# Patient Record
Sex: Male | Born: 1980 | Hispanic: No | Marital: Single | Smoking: Never smoker
Health system: Southern US, Community
[De-identification: ages and names within clinical notes are randomized; demographics above are authoritative.]

## PROBLEM LIST (undated history)

## (undated) DIAGNOSIS — I10 Essential (primary) hypertension: Secondary | ICD-10-CM

## (undated) DIAGNOSIS — E785 Hyperlipidemia, unspecified: Secondary | ICD-10-CM

---

## 1999-09-24 ENCOUNTER — Emergency Department (HOSPITAL_COMMUNITY): Admission: EM | Admit: 1999-09-24 | Discharge: 1999-09-24 | Payer: Self-pay | Admitting: Emergency Medicine

## 2016-09-20 ENCOUNTER — Encounter (HOSPITAL_COMMUNITY): Payer: Self-pay | Admitting: Emergency Medicine

## 2016-09-20 ENCOUNTER — Emergency Department (HOSPITAL_COMMUNITY): Payer: Self-pay

## 2016-09-20 ENCOUNTER — Emergency Department (HOSPITAL_COMMUNITY)
Admission: EM | Admit: 2016-09-20 | Discharge: 2016-09-20 | Disposition: A | Discharge status: Home / Self Care | Payer: Self-pay | Attending: Emergency Medicine | Admitting: Emergency Medicine

## 2016-09-20 DIAGNOSIS — S51812A Laceration without foreign body of left forearm, initial encounter: Secondary | ICD-10-CM | POA: Insufficient documentation

## 2016-09-20 DIAGNOSIS — Y929 Unspecified place or not applicable: Secondary | ICD-10-CM | POA: Insufficient documentation

## 2016-09-20 DIAGNOSIS — I10 Essential (primary) hypertension: Secondary | ICD-10-CM | POA: Insufficient documentation

## 2016-09-20 DIAGNOSIS — Z23 Encounter for immunization: Secondary | ICD-10-CM | POA: Insufficient documentation

## 2016-09-20 DIAGNOSIS — Y939 Activity, unspecified: Secondary | ICD-10-CM | POA: Insufficient documentation

## 2016-09-20 DIAGNOSIS — W25XXXA Contact with sharp glass, initial encounter: Secondary | ICD-10-CM | POA: Insufficient documentation

## 2016-09-20 DIAGNOSIS — Y990 Civilian activity done for income or pay: Secondary | ICD-10-CM | POA: Insufficient documentation

## 2016-09-20 DIAGNOSIS — S60221A Contusion of right hand, initial encounter: Secondary | ICD-10-CM | POA: Insufficient documentation

## 2016-09-20 HISTORY — DX: Essential (primary) hypertension: I10

## 2016-09-20 HISTORY — DX: Hyperlipidemia, unspecified: E78.5

## 2016-09-20 MED ORDER — IBUPROFEN 800 MG PO TABS: 800.0000 mg | ORAL_TABLET | Freq: Three times a day (TID) | ORAL | 0 refills | Status: AC | PRN

## 2016-09-20 MED ORDER — LIDOCAINE HCL 1 % IJ SOLN
INTRAMUSCULAR | Status: AC
  Filled 2016-09-20: qty 20

## 2016-09-20 MED ORDER — TETANUS-DIPHTH-ACELL PERTUSSIS 5-2.5-18.5 LF-MCG/0.5 IM SUSP
0.5000 mL | Freq: Once | INTRAMUSCULAR | Status: AC
  Administered 2016-09-20: 0.5 mL via INTRAMUSCULAR
  Filled 2016-09-20: qty 0.5

## 2016-09-20 MED ORDER — LIDOCAINE HCL (PF) 1 % IJ SOLN: 5.0000 mL | Freq: Once | INTRAMUSCULAR | Status: DC

## 2016-09-20 MED ORDER — IBUPROFEN 800 MG PO TABS
800.0000 mg | ORAL_TABLET | Freq: Once | ORAL | Status: AC
  Administered 2016-09-20: 800 mg via ORAL
  Filled 2016-09-20: qty 1

## 2016-09-20 NOTE — ED Triage Notes (Signed)
Pt declined xrays

## 2016-09-20 NOTE — ED Triage Notes (Signed)
Per EMS, patient from work, where he was replacing a window. Window fell and shattered on patient's head. Patient denies LOC and head pain. Patient does report injury to right hand and approx 1 cm laceration to left arm, bleeding controlled. Ambulatory with EMS.

## 2016-09-20 NOTE — ED Notes (Signed)
Bed: WTR6 Expected date:  Expected time:  Means of arrival:  Comments: 

## 2016-09-20 NOTE — Discharge Instructions (Signed)
Read the information below.  Use the prescribed medication as directed.  Please discuss all new medications with your pharmacist.  You may return to the Emergency Department at any time for worsening condition or any new symptoms that concern you.    If you develop redness, swelling, pus draining from the wound, or fevers greater than 100.4, return to the ER immediately for a recheck.    Lea la informacin a continuacin. Use la medicacin prescrita segn lo dirigido. Discuta todos los medicamentos nuevos con su farmacutico. Puede regresar al Departamento de Emergencia en cualquier momento por empeoramiento de la condicin o cualquier sntoma nuevo que le preocupe. Si desarrolla enrojecimiento, hinchazn, pus drenante de la herida o fiebres mayores de 100.4, regrese a la sala de urgencias inmediatamente para una nueva comprobacin.

## 2016-09-20 NOTE — ED Provider Notes (Signed)
[facility]-EMERGENCY DEPT Provider Note   CSN: [id number] Arrival date & time: 09/20/16  1144  By signing my name below, I, Placido Sou, attest that this documentation has been prepared under the direction and in the presence of [facility], PA-C. Electronically Signed: Placido Sou, ED Scribe. 09/20/16. 1:21 PM.   History   Chief Complaint Chief Complaint  Patient presents with  . Extremity Laceration    HPI HPI Comments: Steven DEMEO is a 35 y.o. male who is right hand dominant presents to the Emergency Department by ambulance complaining of multiple lacerations with controlled bleeding which occurred PTA. Pt states that he was replacing a large glass window which was dropped and fell striking his head before shattering. He reports an mild laceration to his left volar forearm as well as right dorsal hand. Pt has associated, mild, pain across the affected regions. He is not on anticoagulation. Pt takes regular medications for HTN and high cholesterol. He is unsure of the timing of his most recent tetanus vaccination. No pain or lacerations to head, foreign bodies in eyes, numbness, tingling, weakness or other associated symptoms at this time.   The history is provided by the patient. No language interpreter was used.   Past Medical History:  Diagnosis Date  . Hyperlipidemia   . Hypertension     There are no active problems to display for this patient.   History reviewed. No pertinent surgical history.   Home Medications    Prior to Admission medications   Medication Sig Start Date End Date Taking? Authorizing Provider  ibuprofen (ADVIL,MOTRIN) 800 MG tablet Take 1 tablet (800 mg total) by mouth every 8 (eight) hours as needed for mild pain or moderate pain. 09/20/16   Trixie Dredge, PA-C    Family History History reviewed. No pertinent family history.  Social History Social History  Substance Use Topics  . Smoking status: Never Smoker  . Smokeless tobacco: Never Used    . Alcohol use 1.8 oz/week    3 Shots of liquor per week   Allergies   Patient has no known allergies.  Review of Systems Review of Systems  Constitutional: Negative for fever.  Musculoskeletal: Positive for myalgias. Negative for arthralgias.  Skin: Positive for wound. Negative for color change and pallor.  Allergic/Immunologic: Negative for immunocompromised state.  Neurological: Negative for weakness and numbness.  Psychiatric/Behavioral: Negative for self-injury.   Physical Exam Updated Vital Signs BP 161/99 (BP Location: Left Arm)   Pulse 85   Temp 98.3 F (36.8 C) (Oral)   Resp 20   Ht 5' 6.53" (1.69 m)   Wt 90.7 kg   SpO2 97%   BMI 31.76 kg/m   Physical Exam  Constitutional: He appears well-developed and well-nourished. No distress.  HENT:  Head: Normocephalic and atraumatic.  Neck: Neck supple.  Cardiovascular: Intact distal pulses.   Pulmonary/Chest: Effort normal.  Musculoskeletal: He exhibits edema.  Neurological: He is alert. He has normal strength. No sensory deficit.  Skin: Ecchymosis and laceration noted. He is not diaphoretic.  Right dorsal hand with ecchymosis and edema as well as a small opening in the skin. Moves all fingers. Sensation intact. capillary refill <2 seconds. Grip strength 5/5. Left volar forearm with small laceration vs puncture wound. Hemostatic. FAROM of fingers. Sensation and pulses intact.   Nursing note and vitals reviewed.  ED Treatments / Results  Labs (all labs ordered are listed, but only abnormal results are displayed) Labs Reviewed - No data to display  EKG  EKG Interpretation None       Radiology Dg Forearm Left  Result Date: 09/20/2016 CLINICAL DATA:  Trauma from shattered window with laceration, initial encounter EXAM: LEFT FOREARM - 2 VIEW COMPARISON:  None. FINDINGS: There is no evidence of fracture or other focal bone lesions. Soft tissues are unremarkable. IMPRESSION: No acute abnormality noted.  Electronically Signed   By: Alcide CleverMark  Lukens M.D.   On: 09/20/2016 14:39   Dg Hand Complete Right  Result Date: 09/20/2016 CLINICAL DATA:  Contusion with broken glass EXAM: RIGHT HAND - COMPLETE 3+ VIEW COMPARISON:  None. FINDINGS: Four views of the right hand submitted. No acute fracture or subluxation. No definite radiopaque foreign body is identified. IMPRESSION: No acute fracture or subluxation. No radiopaque foreign body is identified. Electronically Signed   By: Natasha MeadLiviu  Pop M.D.   On: 09/20/2016 14:37    Procedures .Marland Kitchen.Laceration Repair Date/Time: 09/20/2016 3:00 PM Performed by: Trixie DredgeWEST, Karn Derk Authorized by: Trixie DredgeWEST, Chrisanna Mishra   Consent:    Consent obtained:  Verbal   Consent given by:  Patient   Risks discussed:  Pain   Alternatives discussed:  No treatment Anesthesia (see MAR for exact dosages):    Anesthesia method:  Local infiltration   Local anesthetic:  Lidocaine 1% w/o epi Laceration details:    Location:  Shoulder/arm   Shoulder/arm location:  L lower arm Repair type:    Repair type:  Simple Pre-procedure details:    Preparation:  Patient was prepped and draped in usual sterile fashion and imaging obtained to evaluate for foreign bodies Exploration:    Hemostasis achieved with:  Direct pressure   Wound exploration: wound explored through full range of motion and entire depth of wound probed and visualized     Contaminated: no   Treatment:    Amount of cleaning:  Standard   Visualized foreign bodies/material removed: no   Skin repair:    Repair method:  Sutures   Suture size:  5-0   Wound skin closure material used: Vicryl.   Suture technique:  Simple interrupted   Number of sutures:  2 Approximation:    Approximation:  Close   Vermilion border: well-aligned   Post-procedure details:    Patient tolerance of procedure:  Tolerated well, no immediate complications     DIAGNOSTIC STUDIES: Oxygen Saturation is 97% on RA, normal by my interpretation.    COORDINATION OF  CARE: 1:14 PM Discussed next steps with pt. Pt verbalized understanding and is agreeable with the plan.    Medications Ordered in ED Medications  Tdap (BOOSTRIX) injection 0.5 mL (0.5 mLs Intramuscular Given 09/20/16 1345)  ibuprofen (ADVIL,MOTRIN) tablet 800 mg (800 mg Oral Given 09/20/16 1323)    Initial Impression / Assessment and Plan / ED Course  I have reviewed the triage vital signs and the nursing notes.  Pertinent labs & imaging results that were available during my care of the patient were reviewed by me and considered in my medical decision making (see chart for details).  Clinical Course     Contusion to right hand and small laceration to left forearm after plate glass fell on him and broke.  Tetanus updated in the ED. Laceration occurred < 12 hours prior to repair. Discussed laceration care with pt and answered questions. Pt is hemodynamically stable with no complaints prior to dc.  No head injury.  No other injuries.  Xrays negative.  Discussed result, findings, treatment, and follow up  with patient.  Pt given return precautions.  Pt verbalizes understanding  and agrees with plan.      I personally performed the services described in this documentation, which was scribed in my presence. The recorded information has been reviewed and is accurate.  Final Clinical Impressions(s) / ED Diagnoses   Final diagnoses:  Laceration of left forearm, initial encounter  Contusion of right hand, initial encounter    New Prescriptions Discharge Medication List as of 09/20/2016  3:49 PM    START taking these medications   Details  ibuprofen (ADVIL,MOTRIN) 800 MG tablet Take 1 tablet (800 mg total) by mouth every 8 (eight) hours as needed for mild pain or moderate pain., Starting Wed 09/20/2016, Print         HighlandEmily Gurnoor Ursua, PA-C 09/20/16 2023    Bethann BerkshireJoseph Zammit, MD 09/21/16 1047

## 2016-09-20 NOTE — ED Notes (Signed)
Using clean technique, applied ace wrap to right hand. On the left forearm, applied telfa over the stitches and secured with kerlix and tape. Pt tolerated it well.

## 2017-07-21 IMAGING — DX DG FOREARM 2V*L*
2 series · 2 of 2 positions shown · non-contrast
Comparison: None.

CLINICAL DATA: Trauma from shattered window with laceration,
initial encounter

EXAM:
LEFT FOREARM - 2 VIEW

[forearm ap]
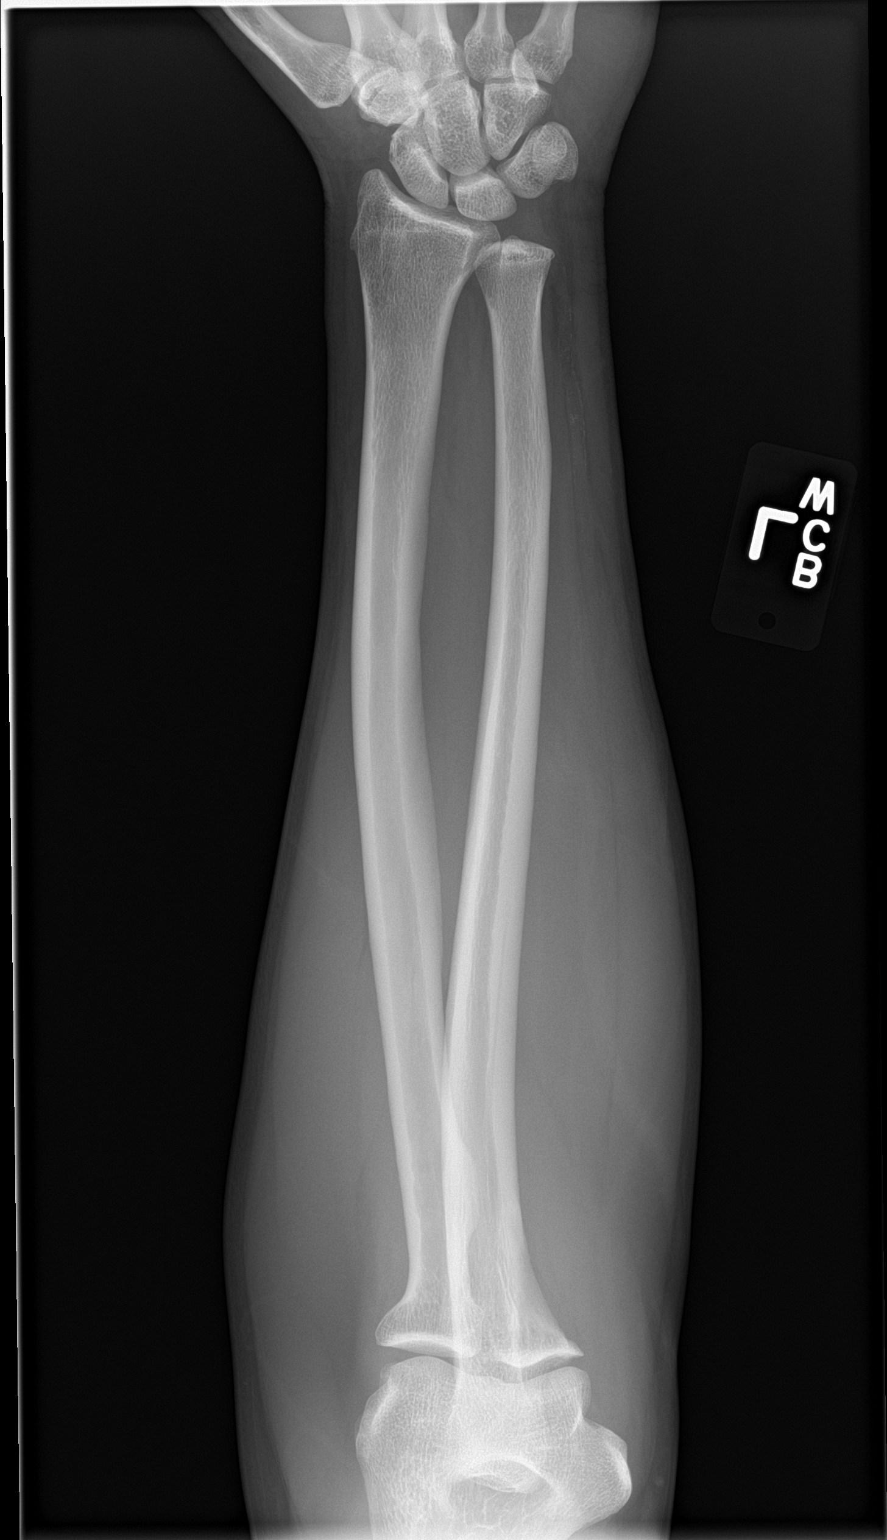

[forearm lat]
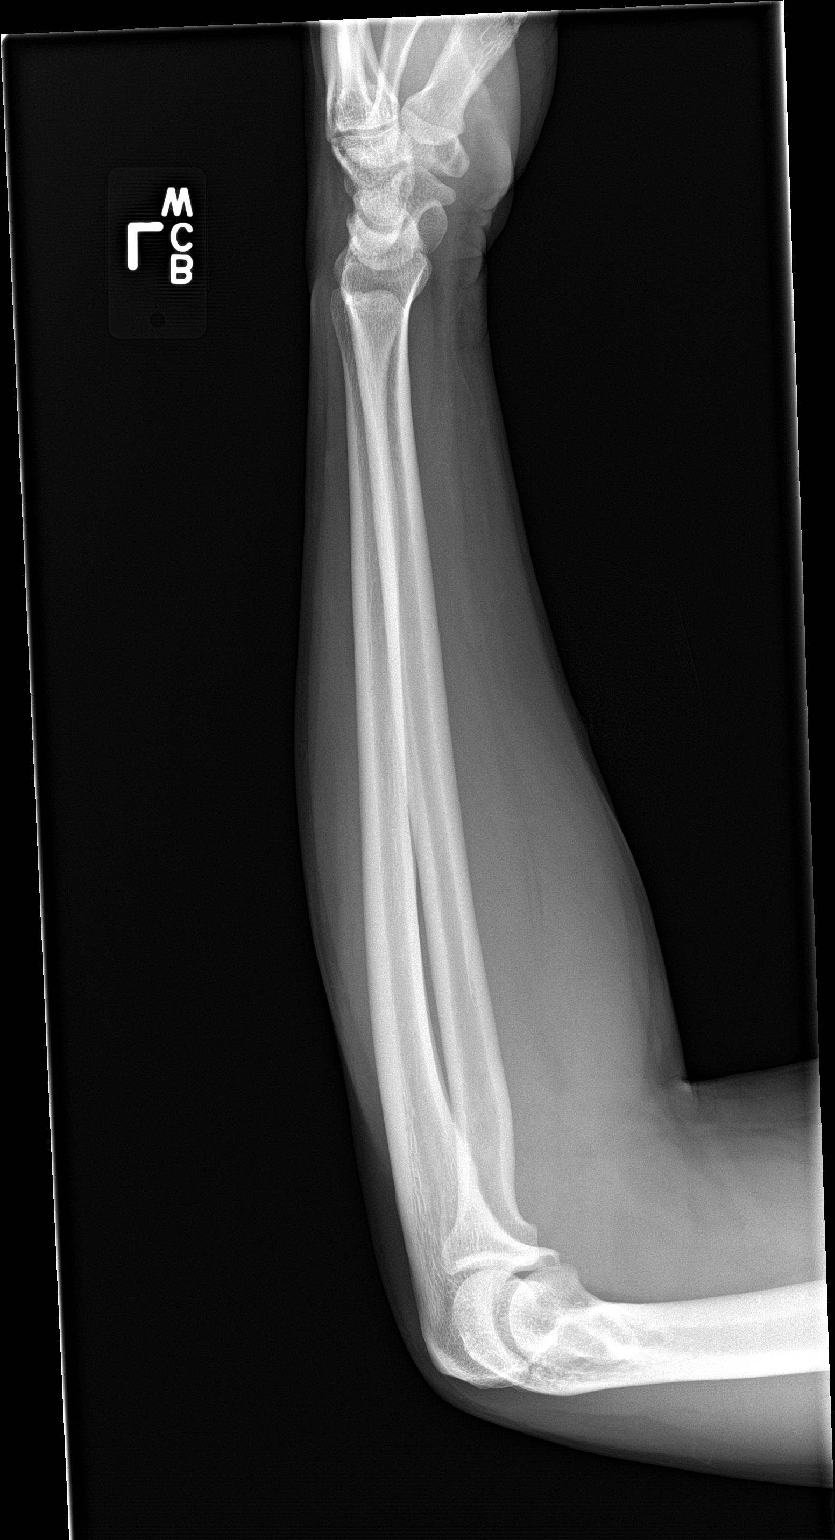

[2 of 2 positions shown; findings below may reference images not displayed]

FINDINGS: There is no evidence of fracture or other focal bone lesions. Soft
tissues are unremarkable.
IMPRESSION: No acute abnormality noted.
